# Patient Record
Sex: Male | Born: 1985 | Race: White | Hispanic: No | Marital: Single | Smoking: Never smoker
Health system: Southern US, Community
[De-identification: ages and names within clinical notes are randomized; demographics above are authoritative.]

## PROBLEM LIST (undated history)

## (undated) DIAGNOSIS — W3400XA Accidental discharge from unspecified firearms or gun, initial encounter: Secondary | ICD-10-CM

## (undated) HISTORY — PX: LEG SURGERY: SHX1003

## (undated) HISTORY — PX: ACHILLES TENDON REPAIR: SUR1153

## (undated) HISTORY — PX: HAND SURGERY: SHX662

## (undated) HISTORY — PX: KNEE SURGERY: SHX244

---

## 2003-07-15 ENCOUNTER — Emergency Department (HOSPITAL_COMMUNITY): Admission: EM | Admit: 2003-07-15 | Discharge: 2003-07-15 | Payer: Self-pay | Admitting: Emergency Medicine

## 2003-08-14 ENCOUNTER — Inpatient Hospital Stay (HOSPITAL_COMMUNITY): Admission: EM | Admit: 2003-08-14 | Discharge: 2003-08-16 | Payer: Self-pay | Admitting: Emergency Medicine

## 2017-01-19 DIAGNOSIS — Z86711 Personal history of pulmonary embolism: Secondary | ICD-10-CM

## 2017-01-19 DIAGNOSIS — Z95828 Presence of other vascular implants and grafts: Secondary | ICD-10-CM

## 2017-01-19 DIAGNOSIS — R220 Localized swelling, mass and lump, head: Secondary | ICD-10-CM

## 2017-01-19 DIAGNOSIS — R6510 Systemic inflammatory response syndrome (SIRS) of non-infectious origin without acute organ dysfunction: Secondary | ICD-10-CM

## 2017-01-19 DIAGNOSIS — F1910 Other psychoactive substance abuse, uncomplicated: Secondary | ICD-10-CM

## 2017-01-19 DIAGNOSIS — Z720 Tobacco use: Secondary | ICD-10-CM

## 2017-07-23 ENCOUNTER — Other Ambulatory Visit: Payer: Self-pay

## 2017-07-23 ENCOUNTER — Encounter (HOSPITAL_COMMUNITY): Payer: Self-pay | Admitting: Emergency Medicine

## 2017-07-23 ENCOUNTER — Emergency Department (HOSPITAL_COMMUNITY)
Admission: EM | Admit: 2017-07-23 | Discharge: 2017-07-23 | Disposition: A | Discharge status: Home / Self Care | Payer: Self-pay | Attending: Emergency Medicine | Admitting: Emergency Medicine

## 2017-07-23 ENCOUNTER — Emergency Department (HOSPITAL_COMMUNITY): Payer: Self-pay

## 2017-07-23 DIAGNOSIS — M79604 Pain in right leg: Secondary | ICD-10-CM | POA: Insufficient documentation

## 2017-07-23 DIAGNOSIS — R52 Pain, unspecified: Secondary | ICD-10-CM

## 2017-07-23 HISTORY — DX: Accidental discharge from unspecified firearms or gun, initial encounter: W34.00XA

## 2017-07-23 MED ORDER — IBUPROFEN 800 MG PO TABS: 800.0000 mg | ORAL_TABLET | Freq: Three times a day (TID) | ORAL | 0 refills | Status: AC

## 2017-07-23 MED ORDER — KETOROLAC TROMETHAMINE 30 MG/ML IJ SOLN
30.0000 mg | Freq: Once | INTRAMUSCULAR | Status: AC
  Administered 2017-07-23: 30 mg via INTRAMUSCULAR
  Filled 2017-07-23: qty 1
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
## 2017-07-23 NOTE — ED Notes (Signed)
Discharge instructions, prescriptions and crutches discussed with Pt. Pt verbalized understanding. Pt stable and ambulatory.

## 2017-07-23 NOTE — Discharge Instructions (Addendum)
I reviewed the tests and imaging conducted at the other hospitals. There has been no injury to the vessels or bones in your legs.  Keep the knee brace on for the next week and use the crutches to get around. Take it easy and ease back into walking on your right leg and doing weight bearing activities.   That bruise will take about 1-2 weeks to resolve. You may apply ice to the area to help with swelling. I have also given you some ibuprofen to help with swelling and pain as well.  You may follow-up with your PCP in 2-4 weeks if you continue to have residual problems with swelling and pain.
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
## 2021-12-03 ENCOUNTER — Encounter (HOSPITAL_COMMUNITY): Payer: Self-pay

## 2021-12-03 ENCOUNTER — Other Ambulatory Visit: Payer: Self-pay

## 2021-12-03 ENCOUNTER — Emergency Department (HOSPITAL_COMMUNITY)
Admission: EM | Admit: 2021-12-03 | Discharge: 2021-12-03 | Discharge status: Against Medical Advice | Payer: Self-pay | Attending: Emergency Medicine | Admitting: Emergency Medicine

## 2021-12-03 DIAGNOSIS — R2243 Localized swelling, mass and lump, lower limb, bilateral: Secondary | ICD-10-CM | POA: Insufficient documentation

## 2021-12-03 DIAGNOSIS — R0602 Shortness of breath: Secondary | ICD-10-CM | POA: Insufficient documentation

## 2021-12-03 DIAGNOSIS — Z5321 Procedure and treatment not carried out due to patient leaving prior to being seen by health care provider: Secondary | ICD-10-CM | POA: Insufficient documentation

## 2021-12-03 NOTE — ED Notes (Signed)
Cody Ferrell with phlebotomy went into his room to obtain labs but the patient refused and stated he was leaving and would be going to [facility] or somewhere else. When asked why he would not elaborate, just stated "its weird here." He was encouraged to stay for treatment and further assessment but he refused. This RN explained to him the risks of leaving AMA and the possibility of having an infection in his heart., was informed of the risks of worsening condition and / or death. He stated he understood the risks and would still like to leave. Pt was instructed to please make sure he goes to a hospital immediately upon leaving and he verbalized understanding.

## 2021-12-03 NOTE — ED Triage Notes (Addendum)
Pt presents with swelling to bilateral legs and feet, onset 3 days ago. Denies CP but does endorse exertional SOB. Legs appear warm to touch and red.

## 2021-12-03 NOTE — ED Provider Triage Note (Cosign Needed)
Emergency Medicine Provider Triage Evaluation Note  Cody Ferrell , a 36 y.o. male  was evaluated in triage.  Pt complains of swelling of both legs and feet with redness that started 3 days ago.  He denies chest pain but does endorse exertional shortness of breath.  Denies fever, chills, cough.  He states it feels like his feet are about to explode.  States he has dental infections and is supposed to have multiple extractions, is not currently on antibiotics.  History of polysubstance abuse.  Review of Systems  Positive: See above Negative: See above  Physical Exam  BP (!) 127/91 (BP Location: Right Arm)   Pulse 92   Temp 98.3 F (36.8 C)   Resp 18   SpO2 99%  Gen:   Awake, no distress   Resp:  Normal effort, lungs clear bilaterally  MSK:   Moves extremities without difficulty, bilateral lower extremity edema with redness, multiple lesions with scabbing in various states of healing Other:  Heart rate and rhythm normal, murmur appreciated on exam  Medical Decision Making  Medically screening exam initiated at 6:42 PM.  Appropriate orders placed.  Cody Ferrell was informed that the remainder of the evaluation will be completed by another provider, this initial triage assessment does not replace that evaluation, and the importance of remaining in the ED until their evaluation is complete.     Lenard Simmer, [place] 12/03/21 1901
